# Patient Record
Sex: Female | Born: 1995 | Hispanic: No | Marital: Married | Smoking: Never smoker
Health system: Southern US, Community
[De-identification: ages and names within clinical notes are randomized; demographics above are authoritative.]

## PROBLEM LIST (undated history)

## (undated) DIAGNOSIS — E559 Vitamin D deficiency, unspecified: Secondary | ICD-10-CM

## (undated) DIAGNOSIS — N90810 Female genital mutilation status, unspecified: Secondary | ICD-10-CM

## (undated) DIAGNOSIS — F419 Anxiety disorder, unspecified: Secondary | ICD-10-CM

## (undated) HISTORY — DX: Anxiety disorder, unspecified: F41.9

## (undated) HISTORY — PX: NO PAST SURGERIES: SHX2092

---

## 2015-03-08 ENCOUNTER — Ambulatory Visit (INDEPENDENT_AMBULATORY_CARE_PROVIDER_SITE_OTHER): Payer: Self-pay | Admitting: Internal Medicine

## 2015-03-08 ENCOUNTER — Encounter: Payer: Self-pay | Admitting: Internal Medicine

## 2015-03-08 VITALS — BP 102/66 | HR 68 | Ht 60.0 in | Wt 104.0 lb

## 2015-03-08 DIAGNOSIS — Z3009 Encounter for other general counseling and advice on contraception: Secondary | ICD-10-CM

## 2015-03-08 DIAGNOSIS — K5900 Constipation, unspecified: Secondary | ICD-10-CM

## 2015-03-08 DIAGNOSIS — K219 Gastro-esophageal reflux disease without esophagitis: Secondary | ICD-10-CM

## 2015-03-08 DIAGNOSIS — R42 Dizziness and giddiness: Secondary | ICD-10-CM

## 2015-03-08 MED ORDER — FAMOTIDINE 20 MG PO TABS: ORAL_TABLET | ORAL | Status: DC

## 2015-03-08 MED ORDER — PRENATAL VITAMINS 28-0.8 MG PO TABS: ORAL_TABLET | ORAL | Status: DC

## 2015-03-08 NOTE — Patient Instructions (Addendum)
Please join our walking group! Please volunteer with [employer] 8 glasses of water daily Limit starches and a lot of dairy 5 servings of vegetables daily 2 servings of fruit daily

## 2015-03-08 NOTE — Progress Notes (Signed)
   Subjective:    Patient ID: Samantha Shepherd, female    DOB: 29-Apr-1995, 20 y.o.   MRN: [id number]  HPI  1.  Belching:  Started 1 month ago.  Occurs with eating and sometimes just spontaneously at other times.  Not under a lot of stress.  Does not eat tomatoes and onions.  Does not drink alcohol or smoke, no chewing tobacco.  Previously, used to eat chocolate, but cut back about 1 month ago when started with these symptoms.  Also noted she had gained weight, which caused her to stop eating chocolate.  No NSAIDS.  2-3 cans of Coke or [employer] weekly. Belching usually starts about 5 minutes after a meal.  Sometimes can bring up a bit of food with it.    2.  Dizziness:  Last month.  Noted this beginning of January.  Initially describes spinning, like would fall, then states was like she would pass out.  Associated with nausea.  Sometimes nausea would precede dizziness.  Thought she was pregnant, but turned out she was not--one home pregnancy test was positive, but then multiple others tested negative.  Lasted a couple of weeks and went away.  No sense of palpitations LMP January 20th.  Lasted 6 days, which is fairly normal for her.    3.  Problems conceiving:  See above.  Has been trying to get pregnant for past 1 year.  Pt. Is placing stress on herself with attempts to get pregnant.  Her husband is not.   Menarche at 78 or 36.  Period is fairly regular, though can be late at times.  Often does not have a period during Ramadan when she is fasting.  4.  Constipation:  3 weeks ago.  When passes stool that is hard, can have bleeding on tissue.  Feels she drinks 3 bottle of 20 oz daily  1 cup milk daily.  +starches.  Meds: None  Allergies:  NKDA    Review of Systems     Objective:   Physical Exam Healthy appearing young woman, NAD HEENT:  PERRL, EOMI, TMs pearly gray, throat without injection. Neck:  Supple, no adenopathy, no thyromegaly Chest:  CTA CV:  RRR with normal S1 and S2, No S3, S4 or  murmur.  Radial pulses normal and equal. Abd:  S, NT, No HSM or masses appreciated, +BS throughout. Neuro:  A & O x 3, CN II-XII intact, DTRs 2+/4 throughout, Motor 5/5 throughout, Rapid alternating motions normal, finger-nose-finger normal, No elicited symptoms with Hall-Pike, negative Romber, normal gait.      Assessment & Plan:  1.  GERD:  GERD precautions with avoidance of common food causes.  Start Famotidine 40 mg.  2.  Vertigo:  No findings today.  To keep a journal.  Check CBC, CMP, TSH  3.  Family planning/difficulty attaining pregnancy:  Discussed intercourse every other day in week surrounding 14 days after period.  Husband to avoid wearing underwear to bed--should be loose fitting bottoms.  4.  Constipation:  Check TSH. Discussed high fiber diet and increasing fluids.

## 2015-03-10 LAB — COMPREHENSIVE METABOLIC PANEL
ALT: 33 IU/L — AB (ref 0–32)
AST: 29 IU/L (ref 0–40)
Albumin/Globulin Ratio: 1.6 (ref 1.1–2.5)
Albumin: 4.4 g/dL (ref 3.5–5.5)
Alkaline Phosphatase: 55 IU/L (ref 39–117)
BUN/Creatinine Ratio: 19 (ref 8–20)
BUN: 10 mg/dL (ref 6–20)
CHLORIDE: 99 mmol/L (ref 96–106)
CO2: 23 mmol/L (ref 18–29)
CREATININE: 0.53 mg/dL — AB (ref 0.57–1.00)
Calcium: 9.6 mg/dL (ref 8.7–10.2)
GFR calc non Af Amer: 136 mL/min/{1.73_m2} (ref >59)
GFR, EST AFRICAN AMERICAN: 157 mL/min/{1.73_m2} (ref >59)
GLUCOSE: 85 mg/dL (ref 65–99)
Globulin, Total: 2.7 g/dL (ref 1.5–4.5)
Potassium: 4.5 mmol/L (ref 3.5–5.2)
Sodium: 137 mmol/L (ref 134–144)
TOTAL PROTEIN: 7.1 g/dL (ref 6.0–8.5)

## 2015-03-10 LAB — CBC WITH DIFFERENTIAL/PLATELET
BASOS ABS: 0 10*3/uL (ref 0.0–0.2)
Basos: 0 %
EOS (ABSOLUTE): 0.2 10*3/uL (ref 0.0–0.4)
Eos: 2 %
HEMOGLOBIN: 12.7 g/dL (ref 11.1–15.9)
Hematocrit: 38.2 % (ref 34.0–46.6)
IMMATURE GRANS (ABS): 0 10*3/uL (ref 0.0–0.1)
Immature Granulocytes: 0 %
LYMPHS: 46 %
Lymphocytes Absolute: 3.2 10*3/uL — ABNORMAL HIGH (ref 0.7–3.1)
MCH: 30.5 pg (ref 26.6–33.0)
MCHC: 33.2 g/dL (ref 31.5–35.7)
MCV: 92 fL (ref 79–97)
MONOCYTES: 7 %
Monocytes Absolute: 0.5 10*3/uL (ref 0.1–0.9)
NEUTROS ABS: 3.1 10*3/uL (ref 1.4–7.0)
Neutrophils: 45 %
Platelets: 162 10*3/uL (ref 150–379)
RBC: 4.17 x10E6/uL (ref 3.77–5.28)
RDW: 13.5 % (ref 12.3–15.4)
WBC: 7 10*3/uL (ref 3.4–10.8)

## 2015-03-10 LAB — TSH: TSH: 2.45 u[IU]/mL (ref 0.450–4.500)

## 2015-03-19 ENCOUNTER — Encounter: Payer: Self-pay | Admitting: Internal Medicine

## 2015-04-06 ENCOUNTER — Ambulatory Visit (INDEPENDENT_AMBULATORY_CARE_PROVIDER_SITE_OTHER): Payer: Self-pay | Admitting: Internal Medicine

## 2015-04-06 ENCOUNTER — Encounter: Payer: Self-pay | Admitting: Internal Medicine

## 2015-04-06 VITALS — BP 108/68 | HR 68 | Resp 16 | Ht 60.0 in | Wt 104.5 lb

## 2015-04-06 DIAGNOSIS — K089 Disorder of teeth and supporting structures, unspecified: Secondary | ICD-10-CM

## 2015-04-06 DIAGNOSIS — K5900 Constipation, unspecified: Secondary | ICD-10-CM

## 2015-04-06 DIAGNOSIS — K219 Gastro-esophageal reflux disease without esophagitis: Secondary | ICD-10-CM

## 2015-04-06 DIAGNOSIS — N979 Female infertility, unspecified: Secondary | ICD-10-CM

## 2015-04-06 DIAGNOSIS — L659 Nonscarring hair loss, unspecified: Secondary | ICD-10-CM

## 2015-04-06 NOTE — Progress Notes (Signed)
Subjective:    Patient ID: Samantha Shepherd, female    DOB: February 13, 1995, 20 y.o.   MRN: [id number]  HPI  1.  GERD:  Taking Famotidine 40 mg daily without difficulties.  Now having symptoms once weekly with belching.  No more nausea.  Only has burning into chest if eats something spicy, which she is avoiding.  No blood in stool or melena.  Did have blood with passing a hard stool and straining about 3 weeks ago.  CBC, CMP, TSH all normal with last visit.  2.  Constipation:  TSH normal.  This has resolved with eating more fruit since last visit.  3.  Hair concerns:  Has noted thinning concern since January.  Does not notes lots of hair when combing or on pillow or shower.  She just feels there is less.  Feels her hair thinning is worse since last seen. During exam, patients admits to receding hairline at temples since she was a very young child.  Does pull her hair back rather tightly on a regular basis under her hair covering.  4.  Difficulties with getting pregnant. Menarche was age 39 yo.  Has never had a monthly period for a whole year.  States can be up to 5 times per year where she skips a period or it is 3 weeks late.   Sometimes has more cramping and increased flow when skips a period with the period that follows. Periods generally last 6-7 days, no matter if has skipped or not.   Denies ever having breast tenderness prior to or with periods.   No history of hirsutism. No pelvic pain.   No nipple discharge.   Has never been told her blood glucose is high.   Has never had a pap smear.    5.  Dental concerns:  Would like to be seen by dentist for regular cleaning and check.  No ongoing problems, however.   Current outpatient prescriptions:  .  famotidine (PEPCID) 20 MG tablet, 2 tabs by mouth at bedtime, Disp: 60 tablet, Rfl: 1 .  Prenatal Vit-Fe Fumarate-FA (PRENATAL VITAMINS) 28-0.8 MG TABS, 1 tab by mouth daily in morning as long as no nausea., Disp: 30 tablet, Rfl: 11   Allergies:   NKDA     Review of Systems     Objective:   Physical Exam NAD HEENT: Has significant temporal hairline recession.  Many short hairs populate this area as well as area behind bang line.  Hard to say if these hairs are broken or not.  Her hair is quite long and heavy--very damaged at ends of long hairs.  Hair is heavily oiled, which also makes exam somewhat difficult regarding amount of hair.   No obvious dental cavities, Neck:  Supple, no adenopathy, no thyromegaly Chest:  CTA CV:  RRR without murmur or rub, radial pulses normal and equal. Abd:  S, NT, No HSM or masses, +BS Skin:  No hirsute areas of arms, chest, abdomen, chin.  Pt. Wanted to defer pelvic and pap exam        Assessment & Plan:  1.  GERD:  Much better at this point.  To continue Famotidine for another month and asymptomatic at that point, to use as needed thereafter.  2.  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  With concerns for fertility and perhaps PCOS as well, will start with check of total testosterone. Will gradually order insulin and FSH as well. Encouraged patient to  avoid hairstyle with signficant tension on hair.  3.  Concerns for infertility:  Save for receding hairline, which has been evident since very young, no concerning findings such as hirsutism for hyperandrogenism, however, start with testosterone level.  Will check insulin and FSH next visit due to cost.  Needs pelvic and pap, which have never been performed, but pt. Chose to wait for a specific appt. To do this. [id number]  $88 Insulin 004333  $59.00 Total Testosterone 004226 $128.5  4.  Dental :  [facility] on [employer] given.  Also refer to dental clinic  5.  Constipation;  Resolved with increased fruit.  Discussed increasing vegetable intake as well

## 2015-04-06 NOTE — Patient Instructions (Addendum)
Avoid wearing your hair in styles that put a lot of tension on your hair. [facility] for regular cleaning. Take Famotidine for one more month daily and then go to use only as needed.

## 2015-04-07 LAB — TESTOSTERONE: Testosterone: 23 ng/dL (ref 8–48)

## 2015-05-23 ENCOUNTER — Ambulatory Visit (INDEPENDENT_AMBULATORY_CARE_PROVIDER_SITE_OTHER): Payer: Self-pay | Admitting: Internal Medicine

## 2015-05-23 ENCOUNTER — Encounter: Payer: Self-pay | Admitting: Internal Medicine

## 2015-05-23 VITALS — BP 98/60 | HR 80 | Resp 20 | Ht 60.0 in | Wt 103.5 lb

## 2015-05-23 DIAGNOSIS — L80 Vitiligo: Secondary | ICD-10-CM | POA: Insufficient documentation

## 2015-05-23 NOTE — Patient Instructions (Signed)
Call if you decide you are ready to try the pap and pelvic exam with some medication to calm your anxiety

## 2015-05-23 NOTE — Progress Notes (Signed)
Subjective:    Patient ID: Samantha Shepherd, female    DOB: November 14, 1995, 20 y.o.   MRN: [id number]  HPI   Here for CPE with pap  Health Maintenance:   1.  Last pap:  Never.    2.  SBE:  Never.  Has never had a mammogram for any reason.  3.  Immunizations:  Has not had tetanus since age 13, 10 years ago.  Did not get influenza vaccine.   Immunizations through [employer] up in [place]  4.  Cholesterol:  Has never been checked.   Nonfasting.   Current outpatient prescriptions:  .  famotidine (PEPCID) 20 MG tablet, 2 tabs by mouth at bedtime, Disp: 60 tablet, Rfl: 1 .  Prenatal Vit-Fe Fumarate-FA (PRENATAL VITAMINS) 28-0.8 MG TABS, 1 tab by mouth daily in morning as long as no nausea., Disp: 30 tablet, Rfl: 11   No Known Allergies   History reviewed. No pertinent past medical history.   History reviewed. No pertinent past surgical history.   Family History  Problem Relation Age of Onset  . Epilepsy Sister   . Asthma Sister          Review of Systems  Constitutional: Negative for fever, appetite change and fatigue.  HENT: Negative for dental problem.        Dentist appt. May 8th with [facility]  Eyes:       Vision stable with current lens Rx.   Goes somewhere in [place].  Respiratory:       Rarely when lies down, needs to sigh breaths and then gone in seconds.  Cardiovascular: Negative for chest pain, palpitations and leg swelling.  Gastrointestinal: Negative for nausea, vomiting, abdominal pain, diarrhea, constipation and blood in stool.  Endocrine: Negative for cold intolerance, heat intolerance, polydipsia and polyuria.  Genitourinary: Positive for menstrual problem. Negative for difficulty urinating.       Missed or delayed periods at times.  Missed in April.  Has occurred.  Started after married.  Does not feel stressed.  Musculoskeletal: Negative for arthralgias and gait problem.  Skin:       Developing brown dots on skin--points to tiny moles  on arms and abdomen  Allergic/Immunologic: Negative for environmental allergies.  Neurological: Negative for weakness and numbness.  Psychiatric/Behavioral: Negative for sleep disturbance and dysphoric mood. The patient is not nervous/anxious.        Objective:   Physical Exam  Constitutional: She is oriented to person, place, and time. She appears well-developed and well-nourished.  HENT:  Head: Normocephalic and atraumatic.  Right Ear: Hearing, tympanic membrane, external ear and ear canal normal.  Left Ear: Hearing, tympanic membrane, external ear and ear canal normal.  Nose: Nose normal.  Mouth/Throat: Uvula is midline, oropharynx is clear and moist and mucous membranes are normal. Normal dentition.  Eyes: Conjunctivae and EOM are normal. Pupils are equal, round, and reactive to light.  Discs sharp bilaterally  Neck: Normal range of motion. Neck supple. No thyroid mass and no thyromegaly present.  Cardiovascular: Normal rate, regular rhythm, S1 normal, S2 normal and normal pulses.  Exam reveals no S3, no S4 and no friction rub.   No murmur heard. Pulmonary/Chest: Effort normal and breath sounds normal. Right breast exhibits no inverted nipple, no mass, no nipple discharge, no skin change and no tenderness. Left breast exhibits no inverted nipple, no mass, no nipple discharge, no skin change and no tenderness.  Abdominal: Soft. Bowel sounds are normal. She exhibits no mass. There  is no hepatosplenomegaly. There is no tenderness.  Genitourinary:  External Genitalia appears normal.  Do not see definitive changes of female circumcision. Patient unable to relax for exam.  Unable to insert speculum more than 1 cm.  Pt. Becomes anxious and states it is painful, asking to stop with exam.  Musculoskeletal: Normal range of motion.  Lymphadenopathy:       Head (right side): No submental and no submandibular adenopathy present.       Head (left side): No submental and no submandibular adenopathy  present.    She has no cervical adenopathy.    She has no axillary adenopathy.       Right: No inguinal adenopathy present.       Left: No inguinal adenopathy present.  Neurological: She is alert and oriented to person, place, and time. She has normal strength and normal reflexes. No cranial nerve deficit or sensory deficit. Coordination and gait normal.  Skin: Skin is warm and dry.  Scattered areas of hypopigmentation along entire pretibial area, fairly well demarcated, as well as right inner thigh.  Psychiatric: Her speech is normal and behavior is normal. Judgment and thought content normal. Her mood appears anxious. Cognition and memory are normal.         Assessment & Plan:  1.  CPE without pap and pelvic today as patient could not relax for the exam.  Unable to obtain pap or perform bimanual exam To contemplate coming in and taking a dose of Ativan for the exam.   She is to call if she would like to proceed.   Will only need pap and bimanual exam then. To return fasting for FLP To bring in her immunization record from [place].  Likely due for Td, but need to know if already with Tdap. Recommend monthly SBE Recommend yearly influenza

## 2015-07-27 ENCOUNTER — Other Ambulatory Visit: Payer: Self-pay | Admitting: Internal Medicine

## 2015-07-27 DIAGNOSIS — Z23 Encounter for immunization: Secondary | ICD-10-CM

## 2015-07-27 NOTE — Progress Notes (Signed)
Needs meningococcal 4 valent vaccine for [facility].  Will not be able to get visa without this being administered this week.  [facility] but would not go through as a med.   Will write out on Rx

## 2015-08-01 ENCOUNTER — Other Ambulatory Visit: Payer: Self-pay

## 2016-11-14 ENCOUNTER — Ambulatory Visit: Payer: Self-pay | Admitting: Obstetrics & Gynecology

## 2017-01-30 ENCOUNTER — Ambulatory Visit (INDEPENDENT_AMBULATORY_CARE_PROVIDER_SITE_OTHER): Payer: Self-pay | Admitting: Obstetrics & Gynecology

## 2017-01-30 ENCOUNTER — Encounter: Payer: Self-pay | Admitting: Obstetrics & Gynecology

## 2017-01-30 VITALS — BP 120/82 | Ht 60.0 in | Wt 106.0 lb

## 2017-01-30 DIAGNOSIS — N979 Female infertility, unspecified: Secondary | ICD-10-CM

## 2017-01-30 DIAGNOSIS — Z01419 Encounter for gynecological examination (general) (routine) without abnormal findings: Secondary | ICD-10-CM

## 2017-01-30 DIAGNOSIS — F529 Unspecified sexual dysfunction not due to a substance or known physiological condition: Secondary | ICD-10-CM

## 2017-01-30 NOTE — Progress Notes (Signed)
Samantha Shepherd March 19, 1995 [id number]   History:    22 y.o. G0 Married x 3 yrs.  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  RP:  New patient presenting for annual gyn exam and Primary Infertility x 3 years  HPI:    Past medical history,surgical history, family history and social history were all reviewed and documented in the EPIC chart.  Gynecologic History Patient's last menstrual period was 12/31/2016. Contraception: none Last Pap: Never Last mammogram: N/A  Obstetric History OB History  Gravida Para Term Preterm AB Living  0 0 0 0 0 0  SAB TAB Ectopic Multiple Live Births  0 0 0 0 0         ROS: A ROS was performed and pertinent positives and negatives are included in the history.  GENERAL: No fevers or chills. HEENT: No change in vision, no earache, sore throat or sinus congestion. NECK: No pain or stiffness. CARDIOVASCULAR: No chest pain or pressure. No palpitations. PULMONARY: No shortness of breath, cough or wheeze. GASTROINTESTINAL: No abdominal pain, nausea, vomiting or diarrhea, melena or bright red blood per rectum. GENITOURINARY: No urinary frequency, urgency, hesitancy or dysuria. MUSCULOSKELETAL: No joint or muscle pain, no back pain, no recent trauma. DERMATOLOGIC: No rash, no itching, no lesions. ENDOCRINE: No polyuria, polydipsia, no heat or cold intolerance. No recent change in weight. HEMATOLOGICAL: No anemia or easy bruising or bleeding. NEUROLOGIC: No headache, seizures, numbness, tingling or weakness. PSYCHIATRIC: No depression, no loss of interest in normal activity or change in sleep pattern.     Exam:   BP 120/82 (BP Location: Right Arm, Patient Position: Sitting, Cuff Size: Normal)   Ht 5' (1.524 m)   Wt 106 lb (48.1 kg)   LMP 12/31/2016   BMI 20.70 kg/m   Body mass index is 20.7 kg/m.  General appearance : Well developed well nourished female. No acute distress HEENT: Eyes: no retinal hemorrhage or exudates,  Neck supple, trachea midline, no carotid  bruits, no thyroidmegaly Lungs: Clear to auscultation, no rhonchi or wheezes, or rib retractions  Heart: Regular rate and rhythm, no murmurs or gallops Breast:Examined in sitting and supine position were symmetrical in appearance, no palpable masses or tenderness,  no skin retraction, no nipple inversion, no nipple discharge, no skin discoloration, no axillary or supraclavicular lymphadenopathy Abdomen: no palpable masses or tenderness, no rebound or guarding Extremities: no edema or skin discoloration or tenderness  Pelvic: Vulva normal  Bartholin, Urethra, Skene Glands: Within normal limits             Vagina: No gross lesions or discharge  Cervix: No gross lesions or discharge  Uterus  AV, normal size, shape and consistency, non-tender and mobile  Adnexa  Without masses or tenderness  Anus and perineum  normal   Assessment/Plan:  22 y.o. female for annual exam   1. Well female exam with routine gynecological exam Normal gynecologic exam but intact hymen.  Speculum exam and Pap test declined by patient.  Will proceed with speculum exam and Pap test at follow-up.  Breast exam normal.  2. Primary female infertility Attempted conception for 3 years with sexual activity covering the ovulatory period.  But probably no penetration.  Will proceed with infertility workup only if no conception after adequate timed sexual intercourse is achieved for at least 6 months.  3. Sexual function problem Given her gynecologic exam today barely allowing 1 finger at the hymen, which was intact, and the response of the patient, who said that she never  experienced the penis going deep inside at that location i.e. Vagina, high suspicion that penetration never occurred.  Patient also mentions that her husband's penis is not hard most of the time, but she does feel that sperm is present at the vulva after sexual activity.  Sexual education and counseling done with the patient.  Recommended that patient experiments  with masturbation on her own so that she can feel more comfortable communicating with her husband and guiding him with intercourse.  Will follow up with me in about 3 months to reassess.  Will not proceed with infertility workup until sexual intercourse is optimized.  Counseling on above issues more than 50% for 30 minutes.  Genia Del MD, 11:54 AM 01/30/2017

## 2017-01-30 NOTE — Patient Instructions (Signed)
  1. Well female exam with routine gynecological exam Normal gynecologic exam but intact hymen.  Speculum exam and Pap test declined by patient.  Will proceed with speculum exam and Pap test at follow-up.  Breast exam normal.  2. Primary female infertility Attempted conception for 3 years with sexual activity covering the ovulatory period.  But probably no penetration.  Will proceed with infertility workup only if no conception after adequate timed sexual intercourse is achieved for at least 6 months.  3. Sexual function problem Given her gynecologic exam today barely allowing 1 finger at the hymen, which was intact, and the response of the patient, who said that she never experienced the penis going deep inside at that location i.e. Vagina, high suspicion that penetration never occurred.  Patient also mentions that her husband's penis is not hard most of the time, but she does feel that sperm is present at the vulva after sexual activity.  Sexual education and counseling done with the patient.  Recommended that patient experiments with masturbation on her own so that she can feel more comfortable communicating with her husband and guiding him with intercourse.  Will follow up with me in about 3 months to reassess.  Will not proceed with infertility workup until sexual intercourse is optimized.  Samantha Shepherd, it was a pleasure meeting you today!  I will see you back in 3 months.

## 2017-02-08 ENCOUNTER — Telehealth: Payer: Self-pay | Admitting: *Deleted

## 2017-02-08 NOTE — Telephone Encounter (Signed)
Patient would like to know a name of a good prenatal vitamin you would recommend. Please advise

## 2017-02-10 NOTE — Telephone Encounter (Signed)
They are all good.  OTC generic:  Prenatal Vitamin 28-0.8 mg.  If wants a Brand with prescription: Completenate 29-1 mg chew would be a good, more expensive choice.

## 2017-02-11 NOTE — Telephone Encounter (Signed)
Left message on voicemail any OTC generic.

## 2017-05-27 DIAGNOSIS — Z3400 Encounter for supervision of normal first pregnancy, unspecified trimester: Secondary | ICD-10-CM | POA: Insufficient documentation

## 2017-05-28 ENCOUNTER — Other Ambulatory Visit (HOSPITAL_COMMUNITY)
Admission: RE | Admit: 2017-05-28 | Discharge: 2017-05-28 | Disposition: A | Discharge status: Home / Self Care | Payer: Medicaid Other | Source: Ambulatory Visit | Attending: Certified Nurse Midwife | Admitting: Certified Nurse Midwife

## 2017-05-28 ENCOUNTER — Encounter: Payer: Self-pay | Admitting: Certified Nurse Midwife

## 2017-05-28 ENCOUNTER — Ambulatory Visit (INDEPENDENT_AMBULATORY_CARE_PROVIDER_SITE_OTHER): Payer: Medicaid Other | Admitting: Certified Nurse Midwife

## 2017-05-28 VITALS — BP 130/86 | HR 90 | Temp 98.0°F | Ht 61.0 in | Wt 111.8 lb

## 2017-05-28 DIAGNOSIS — Z3401 Encounter for supervision of normal first pregnancy, first trimester: Secondary | ICD-10-CM | POA: Diagnosis not present

## 2017-05-28 DIAGNOSIS — Z3400 Encounter for supervision of normal first pregnancy, unspecified trimester: Secondary | ICD-10-CM | POA: Insufficient documentation

## 2017-05-28 DIAGNOSIS — N90810 Female genital mutilation status, unspecified: Secondary | ICD-10-CM | POA: Insufficient documentation

## 2017-05-28 DIAGNOSIS — O219 Vomiting of pregnancy, unspecified: Secondary | ICD-10-CM

## 2017-05-28 MED ORDER — VITAFOL-NANO 18-0.6-0.4 MG PO TABS: 1.0000 | ORAL_TABLET | Freq: Every day | ORAL | 12 refills | Status: AC

## 2017-05-28 MED ORDER — DOXYLAMINE-PYRIDOXINE 10-10 MG PO TBEC: DELAYED_RELEASE_TABLET | ORAL | 4 refills | Status: AC

## 2017-05-28 NOTE — Progress Notes (Signed)
Subjective:   Samantha Shepherd is a 22 y.o. G1P0000 at [redacted]w[redacted]d by LMP being seen today for her first obstetrical visit.  Her obstetrical history is significant for severe anxity over vaginal exams. Patient does intend to breast feed. Pregnancy history fully reviewed.  Patient reports nausea, no bleeding, no contractions, no cramping, no leaking and vomiting.  HISTORY: OB History  Gravida Para Term Preterm AB Living  1 0 0 0 0 0  SAB TAB Ectopic Multiple Live Births  0 0 0 0 0    # Outcome Date GA Lbr Len/2nd Weight Sex Delivery Anes PTL Lv  1 Current             Last pap smear was done never, 1st pap smear today with small peds speculum, very difficult.   Past Medical History:  Diagnosis Date  . Anxiety    History reviewed. No pertinent surgical history. Family History  Problem Relation Age of Onset  . Epilepsy Sister   . Asthma Sister    Social History   Tobacco Use  . Smoking status: Never Smoker  . Smokeless tobacco: Never Used  Substance Use Topics  . Alcohol use: No    Alcohol/week: 0.0 oz  . Drug use: No   No Known Allergies Current Outpatient Medications on File Prior to Visit  Medication Sig Dispense Refill  . Prenatal Vit-Fe Fumarate-FA (MULTI PRENATAL PO) Take by mouth.     No current facility-administered medications on file prior to visit.     Review of Systems Pertinent items noted in HPI and remainder of comprehensive ROS otherwise negative.  Exam   Vitals:   05/28/17 1329 05/28/17 1331  BP: 130/86   Pulse: 90   Temp: 98 F (36.7 C)   Weight: 111 lb 12.8 oz (50.7 kg)   Height:   (1.549 m)   Fetal Heart Rate (bpm): 177; doppler  Uterus:  Fundal Height: 14 cm  Pelvic Exam: Perineum: no hemorrhoids, normal perineum   Vulva: normal external genitalia, no lesions, female circumcision status   Vagina:  normal mucosa, normal discharge   Cervix: no lesions and normal, pap smear done.    Adnexa: deferred   Bony Pelvis: average  System:  General: well-developed, well-nourished female in no acute distress   Breast:  normal appearance, no masses or tenderness   Skin: normal coloration and turgor, no rashes   Neurologic: oriented, normal, negative, normal mood   Extremities: normal strength, tone, and muscle mass, ROM of all joints is normal   HEENT PERRLA, extraocular movement intact and sclera clear, anicteric   Mouth/Teeth mucous membranes moist, pharynx normal without lesions and dental hygiene good   Neck supple and no masses   Cardiovascular: regular rate and rhythm   Respiratory:  no respiratory distress, normal breath sounds   Abdomen: soft, non-tender; bowel sounds normal; no masses,  no organomegaly    [place] ordered to confirm dating in office, dates c/w LMP.  Singleton fetus.   Assessment:   Pregnancy: G1P0000 Patient Active Problem List   Diagnosis Date Noted  . 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 Supervision of normal first pregnancy, antepartum 05/27/2017  . Primary vitiligo 05/23/2015     Plan:  1. Supervision of normal first pregnancy, antepartum      - Enroll Patient in Babyscripts - Obstetric Panel, Including HIV - Hemoglobinopathy evaluation - Culture, OB Urine - Cervicovaginal ancillary only - Cytology - PAP - Genetic Screening - Inheritest Core(CF97,SMA,FraX) - [place]  MFM OB COMP + 14 WK; Future - Prenatal-Fe Fum-Methf-FA w/o A (VITAFOL-NANO) 18-0.6-0.4 MG TABS; Take 1 tablet by mouth daily.  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 Nausea/vomiting in pregnancy      - Doxylamine-Pyridoxine (DICLEGIS) 10-10 MG TBEC; Take 1 tablet with breakfast and lunch.  Take 2 tablets at bedtime.  Dispense: 100 tablet; Refill: 4   Initial labs drawn. Continue prenatal vitamins. Genetic Screening discussed, NIPS: ordered. Ultrasound discussed; fetal anatomic survey:  ordered. Problem list reviewed and updated. The nature of [facility] with multiple MDs and other Advanced Practice Providers was explained to patient; also emphasized that residents, students are part of our team. Routine obstetric precautions reviewed. Return in about 1 month (around 06/25/2017) for ROB.     Orvilla Cornwall, CNM [facility], [facility]

## 2017-05-29 ENCOUNTER — Other Ambulatory Visit: Payer: Self-pay

## 2017-05-29 ENCOUNTER — Telehealth: Payer: Self-pay

## 2017-05-29 LAB — CERVICOVAGINAL ANCILLARY ONLY
BACTERIAL VAGINITIS: NEGATIVE
CHLAMYDIA, DNA PROBE: NEGATIVE
Candida vaginitis: NEGATIVE
Neisseria Gonorrhea: NEGATIVE
TRICH (WINDOWPATH): NEGATIVE

## 2017-05-29 NOTE — Telephone Encounter (Signed)
Returned call and advised that PNV were already sent to pharmacy and pharmacy stated that they need insurance info. Pt stated that she would call.

## 2017-05-30 LAB — OBSTETRIC PANEL, INCLUDING HIV
Antibody Screen: NEGATIVE
BASOS ABS: 0 10*3/uL (ref 0.0–0.2)
Basos: 0 %
EOS (ABSOLUTE): 0 10*3/uL (ref 0.0–0.4)
EOS: 0 %
HIV Screen 4th Generation wRfx: NONREACTIVE
Hematocrit: 36.1 % (ref 34.0–46.6)
Hemoglobin: 12.5 g/dL (ref 11.1–15.9)
Hepatitis B Surface Ag: NEGATIVE
IMMATURE GRANULOCYTES: 0 %
Immature Grans (Abs): 0 10*3/uL (ref 0.0–0.1)
LYMPHS ABS: 2.5 10*3/uL (ref 0.7–3.1)
Lymphs: 20 %
MCH: 31.3 pg (ref 26.6–33.0)
MCHC: 34.6 g/dL (ref 31.5–35.7)
MCV: 90 fL (ref 79–97)
MONOS ABS: 0.7 10*3/uL (ref 0.1–0.9)
Monocytes: 5 %
NEUTROS PCT: 75 %
Neutrophils Absolute: 9.1 10*3/uL — ABNORMAL HIGH (ref 1.4–7.0)
PLATELETS: 202 10*3/uL (ref 150–379)
RBC: 4 x10E6/uL (ref 3.77–5.28)
RDW: 13.6 % (ref 12.3–15.4)
RH TYPE: POSITIVE
RPR Ser Ql: NONREACTIVE
Rubella Antibodies, IGG: 3.24 index (ref >0.99)
WBC: 12.3 10*3/uL — ABNORMAL HIGH (ref 3.4–10.8)

## 2017-05-30 LAB — HEMOGLOBINOPATHY EVALUATION
HGB A: 98.1 % (ref 96.4–98.8)
HGB C: 0 %
HGB S: 0 %
HGB VARIANT: 0 %
Hemoglobin A2 Quantitation: 1.9 % (ref 1.8–3.2)
Hemoglobin F Quantitation: 0 % (ref 0.0–2.0)

## 2017-05-30 LAB — HEMOGLOBIN A1C
ESTIMATED AVERAGE GLUCOSE: 94 mg/dL
Hgb A1c MFr Bld: 4.9 % (ref 4.8–5.6)

## 2017-05-30 LAB — VITAMIN D 25 HYDROXY (VIT D DEFICIENCY, FRACTURES): Vit D, 25-Hydroxy: 13.2 ng/mL — ABNORMAL LOW (ref 30.0–100.0)

## 2017-05-31 LAB — CULTURE, OB URINE

## 2017-05-31 LAB — CYTOLOGY - PAP
ADEQUACY: ABSENT
DIAGNOSIS: NEGATIVE

## 2017-05-31 LAB — URINE CULTURE, OB REFLEX

## 2017-06-02 ENCOUNTER — Encounter: Payer: Self-pay | Admitting: Family Medicine

## 2017-06-02 DIAGNOSIS — E559 Vitamin D deficiency, unspecified: Secondary | ICD-10-CM | POA: Insufficient documentation

## 2017-06-03 ENCOUNTER — Other Ambulatory Visit: Payer: Self-pay | Admitting: Certified Nurse Midwife

## 2017-06-03 DIAGNOSIS — E559 Vitamin D deficiency, unspecified: Secondary | ICD-10-CM

## 2017-06-03 DIAGNOSIS — Z3400 Encounter for supervision of normal first pregnancy, unspecified trimester: Secondary | ICD-10-CM

## 2017-06-03 MED ORDER — VITAMIN D (ERGOCALCIFEROL) 1.25 MG (50000 UNIT) PO CAPS: 50000.0000 [IU] | ORAL_CAPSULE | ORAL | 2 refills | Status: DC

## 2017-06-04 ENCOUNTER — Other Ambulatory Visit: Payer: Self-pay

## 2017-06-04 DIAGNOSIS — O285 Abnormal chromosomal and genetic finding on antenatal screening of mother: Secondary | ICD-10-CM

## 2017-06-05 LAB — INHERITEST CORE(CF97,SMA,FRAX)

## 2017-06-07 ENCOUNTER — Other Ambulatory Visit: Payer: Self-pay | Admitting: Certified Nurse Midwife

## 2017-06-07 DIAGNOSIS — Z3400 Encounter for supervision of normal first pregnancy, unspecified trimester: Secondary | ICD-10-CM

## 2017-06-12 ENCOUNTER — Other Ambulatory Visit: Payer: Self-pay | Admitting: Certified Nurse Midwife

## 2017-06-12 ENCOUNTER — Other Ambulatory Visit: Payer: Self-pay

## 2017-06-12 DIAGNOSIS — Z368A Encounter for antenatal screening for other genetic defects: Secondary | ICD-10-CM

## 2017-06-12 DIAGNOSIS — E559 Vitamin D deficiency, unspecified: Secondary | ICD-10-CM

## 2017-06-12 DIAGNOSIS — Z3400 Encounter for supervision of normal first pregnancy, unspecified trimester: Secondary | ICD-10-CM

## 2017-06-12 MED ORDER — VITAMIN D (ERGOCALCIFEROL) 1.25 MG (50000 UNIT) PO CAPS: 50000.0000 [IU] | ORAL_CAPSULE | ORAL | 2 refills | Status: AC

## 2017-06-12 NOTE — Progress Notes (Signed)
Rx was sent to wrong pharmacy .  Resent Rx.

## 2017-06-12 NOTE — Progress Notes (Signed)
Patient notified of uninformative DNA pattern on Panorama.

## 2017-06-20 ENCOUNTER — Encounter (HOSPITAL_COMMUNITY): Payer: Self-pay

## 2017-06-24 ENCOUNTER — Ambulatory Visit (INDEPENDENT_AMBULATORY_CARE_PROVIDER_SITE_OTHER): Payer: Medicaid Other | Admitting: Certified Nurse Midwife

## 2017-06-24 ENCOUNTER — Encounter: Payer: Self-pay | Admitting: Certified Nurse Midwife

## 2017-06-24 VITALS — BP 121/78 | HR 91 | Wt 114.6 lb

## 2017-06-24 DIAGNOSIS — O219 Vomiting of pregnancy, unspecified: Secondary | ICD-10-CM | POA: Diagnosis not present

## 2017-06-24 DIAGNOSIS — Z3402 Encounter for supervision of normal first pregnancy, second trimester: Secondary | ICD-10-CM

## 2017-06-24 DIAGNOSIS — E559 Vitamin D deficiency, unspecified: Secondary | ICD-10-CM | POA: Diagnosis not present

## 2017-06-24 DIAGNOSIS — Z3400 Encounter for supervision of normal first pregnancy, unspecified trimester: Secondary | ICD-10-CM

## 2017-06-24 MED ORDER — ONDANSETRON 8 MG PO TBDP: 8.0000 mg | ORAL_TABLET | Freq: Three times a day (TID) | ORAL | 0 refills | Status: AC | PRN

## 2017-06-24 NOTE — Progress Notes (Signed)
   PRENATAL VISIT NOTE  Subjective:  Samantha Shepherd is a 22 y.o. G1P0000 at 1046w4d being seen today for ongoing prenatal care.  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  Patient reports nausea, no bleeding, no contractions, no cramping, no leaking and vomiting.  Contractions: Not present. Vag. Bleeding: None.  Movement: Absent. Denies leaking of fluid.   The following portions of the patient's history were reviewed and updated as appropriate: allergies, current medications, past family history, past medical history, past social history, past surgical history and problem list. Problem list updated.  Objective:   Vitals:   06/24/17 1433  BP: 121/78  Pulse: 91  Weight: 114 lb 9.6 oz (52 kg)    Fetal Status: Fetal Heart Rate (bpm): 165; doppler   Movement: Absent     General:  Alert, oriented and cooperative. Patient is in no acute distress.  Skin: Skin is warm and dry. No rash noted.   Cardiovascular: Normal heart rate noted  Respiratory: Normal respiratory effort, no problems with respiration noted  Abdomen: Soft, gravid, appropriate for gestational age.  Pain/Pressure: Absent     Pelvic: Cervical exam deferred        Extremities: Normal range of motion.  Edema: None  Mental Status: Normal mood and affect. Normal behavior. Normal judgment and thought content.   Assessment and Plan:  Pregnancy: G1P0000 at 5046w4d  1. Supervision of normal first pregnancy, antepartum     Has gained 8 lbs this pregnancy.  Has anatomy US and genetics counseling scheduled for 06/28/17 d/t hx of uninformative DNA pattern on Panorama.  - AFP, Serum, Open Spina Bifida  2. Vitamin D deficiency      Taking weekly vitamin D.   Preterm labor symptoms and general obstetric precautions including but not limited to vaginal  bleeding, contractions, leaking of fluid and fetal movement were reviewed in detail with the patient. Please refer to After Visit Summary for other counseling recommendations.  Return in about 1 month (around 07/22/2017) for ROB.  Future Appointments  Date Time Provider Department Center  06/28/2017  1:45 PM [facility] US 2 [facility]  06/28/2017  2:00 PM [facility] GENETIC COUNSELING RM [facility]    Roe Coombsachelle A Enedina Pair, CNM

## 2017-06-25 ENCOUNTER — Encounter: Payer: Medicaid Other | Admitting: Certified Nurse Midwife

## 2017-06-27 ENCOUNTER — Other Ambulatory Visit: Payer: Self-pay | Admitting: Certified Nurse Midwife

## 2017-06-27 ENCOUNTER — Ambulatory Visit (HOSPITAL_COMMUNITY)
Admission: RE | Admit: 2017-06-27 | Discharge: 2017-06-27 | Disposition: A | Discharge status: Home / Self Care | Payer: Medicaid Other | Source: Ambulatory Visit | Attending: Certified Nurse Midwife | Admitting: Certified Nurse Midwife

## 2017-06-27 ENCOUNTER — Encounter (HOSPITAL_COMMUNITY): Payer: Self-pay

## 2017-06-27 ENCOUNTER — Other Ambulatory Visit (HOSPITAL_COMMUNITY): Payer: Self-pay | Admitting: *Deleted

## 2017-06-27 ENCOUNTER — Encounter: Payer: Self-pay | Admitting: Certified Nurse Midwife

## 2017-06-27 DIAGNOSIS — O99342 Other mental disorders complicating pregnancy, second trimester: Secondary | ICD-10-CM | POA: Diagnosis not present

## 2017-06-27 DIAGNOSIS — Z362 Encounter for other antenatal screening follow-up: Secondary | ICD-10-CM

## 2017-06-27 DIAGNOSIS — Z363 Encounter for antenatal screening for malformations: Secondary | ICD-10-CM | POA: Diagnosis not present

## 2017-06-27 DIAGNOSIS — Z368A Encounter for antenatal screening for other genetic defects: Secondary | ICD-10-CM | POA: Insufficient documentation

## 2017-06-27 DIAGNOSIS — Z3400 Encounter for supervision of normal first pregnancy, unspecified trimester: Secondary | ICD-10-CM

## 2017-06-27 DIAGNOSIS — Z843 Family history of consanguinity: Secondary | ICD-10-CM

## 2017-06-27 DIAGNOSIS — Z3A18 18 weeks gestation of pregnancy: Secondary | ICD-10-CM | POA: Insufficient documentation

## 2017-06-27 DIAGNOSIS — O289 Unspecified abnormal findings on antenatal screening of mother: Secondary | ICD-10-CM | POA: Insufficient documentation

## 2017-06-27 DIAGNOSIS — F419 Anxiety disorder, unspecified: Secondary | ICD-10-CM | POA: Diagnosis not present

## 2017-06-27 DIAGNOSIS — Z315 Encounter for genetic counseling: Secondary | ICD-10-CM | POA: Diagnosis present

## 2017-06-27 DIAGNOSIS — O281 Abnormal biochemical finding on antenatal screening of mother: Secondary | ICD-10-CM | POA: Diagnosis not present

## 2017-06-27 HISTORY — DX: Vitamin D deficiency, unspecified: E55.9

## 2017-06-27 HISTORY — DX: Female genital mutilation status, unspecified: N90.810

## 2017-06-27 LAB — AFP, SERUM, OPEN SPINA BIFIDA
AFP MOM: 1.19
AFP Value: 57.1 ng/mL
Gest. Age on Collection Date: 17.6 weeks
MATERNAL AGE AT EDD: 24.2 a
OSBR Risk 1 IN: 6704
TEST RESULTS AFP: NEGATIVE
Weight: 115 [lb_av]

## 2017-06-27 NOTE — Progress Notes (Signed)
Genetic Counseling  High-Risk Gestation Note  Appointment Date:  06/27/2017 Referred By: Samantha Shepherd Shepherd, CNM Date of Birth:  [birth year] Partner: Samantha Shepherd Shepherd   Pregnancy History: G1P0000 Estimated Date of Delivery: 11/28/17 Estimated Gestational Age: 67w0dAttending: MRenella Cunas MD   Samantha Shepherd Shepherd was seen for genetic counseling because of no results on noninvasive prenatal screening (NIPS)/prenatal cell [facility] due to uninformative DNA pattern. Ms. DHennekeis 22y.o..     In summary:  Discusseduninformative NIPS (Panorama) result  Reviewedcauses including family history of consanguinity, normal population variation, underlying chromosome conditions ? Couple does not have known consanguinity; however each of their parents are consanguineous and both families are from [place] Discussed alternative screening options ? NIPS using counting methodology-patient declined  Discussed diagnostic testing- amniocentesis declined  Ultrasound performed today- see separate report  Discussed general population carrier screening options ? CF, SMA, and hemoglobinopathies-previously performed and screen neg ? Offered expanded carrier screening given possible consanguinity for couple- patient declined  Samantha Shepherd Shepherd had noninvasive prenatal screening (NIPS) through her referring provider's office. Specifically, she had Panorama screening through [facility] result was not provided due to an uninformative DNA pattern. We spent time discussing genes, chromosomes, and examples of chromosome conditions. We reviewed NIPS technology and that the accuracy of this testing relies on the ability to distinguish specific DNA sequence variations between the mother and the fetus (placental cell free DNA). We discussed that in some cases, it is difficult to distinguish a difference between maternal and placental DNA sequences, making it difficult to interpret the results.  This can happen because the mother and the fetus share more DNA sequences in common than expected, possibly due to general population variation or in cases of consanguinity for couples. Uninformative DNA patterns can also be observed when there is an underlying chromosome/genetic condition in either the pregnancy or the woman or due to limitations of the testing algorithm.   The patient reported no known consanguinity for herself and her husband, but she reported that there is a possibility of consanguinity given that their families [place]. The patient reported that her parents are first cousins to each other, and her husband's parents are also cousins (degree of relation not known today by patient). We discussed that this family history would be expected to increase the degree of DNA sequences that are in common that were analyzed on NIPS and would be a top differential for underlying etiology of uninformative DNA pattern. She understands that other explanations for uninformative DNA pattern cannot be ruled out at this time.   She was counseled regarding maternal age and the association with risk for chromosome conditions due to nondisjunction with aging of the ova.   We reviewed chromosomes, nondisjunction, and the associated risk for fetal aneuploidy related to a maternal age of 22y.o..  She was counseled that the risk for aneuploidy decreases as gestational age increases, accounting for those pregnancies which spontaneously abort.  We specifically discussed Down syndrome (trisomy 271, trisomies 116and 147 and sex chromosome aneuploidies (47,XXX and 47,XXY) including the common features and prognoses of each.   We reviewed that Samantha Shepherd Canceluses a sMusician(Public house manager and that other laboratories ([facility]) use a dArtist(counting), where the individual fragments of DNA are analyzed in slightly different ways.  We discussed additional screening for aneuploidy utilizing a different NIPS technology and detailed anatomy ultrasound. We  reviewed the limitations and benefits of each of these options. We also discussed the option of diagnostic testing via amniocentesis. We reviewed the associated risks, benefits andlimitationsof amniocentesis, including the associated risk for complications.  We discussed the possible results that the tests might provide including: positive, negative, unanticipated, and no result.  After careful consideration, Samantha Shepherd Shepherd declined NIPS through a different platform and also declined amniocentesis.    A complete ultrasound was performed today. The ultrasound report will be sent under separate cover. There were no visualized fetal anomalies or markers suggestive of aneuploidy. Diagnostic testing was declined today.  She understands that screening tests cannot rule out all birth defects or genetic syndromes.  Follow-up ultrasound was scheduled in 4 weeks to complete fetal anatomic survey.   Samantha Shepherd Shepherd  was provided with written information regarding cystic fibrosis (CF), spinal muscular atrophy (SMA) and hemoglobinopathies including the carrier frequency, availability of carrier screening and prenatal diagnosis if indicated.  In addition, we discussed that CF and hemoglobinopathies are routinely screened for as part of the [place] newborn screening panel. The patient previously had carrier screening performed for these conditions and Fragile X through her Ob provider, and all were within normal limits.   Both family histories were reviewed and found to be contributory for two neonatal deaths for the patient's husband's siblings. The patient reported that her husband is one of eight, and that two babies died prior to age 30 years old. No birth defects were described, and no underlying cause was known by the patient today. We discussed that without additional information regarding underlying  etiology, recurrence risk for relatives cannot be assessed.   Given the possible consanguinity for the couple, we discussed the additional screening option of expanded pan-ethnic carrier screening. We discussed that children born to a consanguineous couple are at increased risk for genetic conditions.  This increase in risk is related to the possibility of both parents passing on a recessive gene. We explained that every person carries approximately 7-10 non-working genes that when received in a double dose results in a recessive genetic condition.  In general, unrelated couples have a relatively low risk of having a child with a recessive condition because the likelihood of both parents carrying the same non-working recessive gene is very low.  However, when a couple is related, they have inherited some of their genetic information from the same family member, which leads to an increased chance that they may carry the same recessive gene and have a child with a recessive condition.  We discussed that more closely related couples would have a higher chance and more distantly related couples would have a decreasing change of autosomal recessive and multifactorial conditions in offspring.   There are a myriad of genetic disorders that occur more frequently in specific ethnic groups, those which can be traced to particular geographic locations. We discussed that although these genetic disorders are much more prevalent in specific ethnic groups, as families are becoming increasingly multiracial and multicultural, these conditions can occur in anyone from any race or ethnicity. For this reason, we discussed the availability of ethnic specific genetic carrier screening, professional society (ACOG) recommended carrier testing, and pan-ethnic carrier screening.   We reviewed that ACOG currently recommends that all patients be offered carrier screening for cystic fibrosis, spinal muscular atrophy and  hemoglobinopathies. In addition, they were counseled that there are a variety of genetic screening laboratories that have pan-ethnic, or expanded, carrier screening panels, which evaluate carrier status for a  wide range of genetic conditions. Some of these conditions are severe and actionable, but also rare; others occur more commonly, but are less severe. We discussed that testing options range from screening for a single condition to panels of more than 200 autosomal or X-linked genetic conditions. We reviewed that the prevalence of each condition varies (and often varies with ethnicity). Thus the couples' background risk to be a carrier for each of these various conditions would range, and in some cases be very low or unknown. Similarly, the detection rate varies with each condition and also varies in some cases with ethnicity, ranging from greater than 99% (in the case of hemoglobinopathies) to unknown. We reviewed that a negative carrier screen would thus reduce, but not eliminate the chance to be a carrier for these conditions. For some conditions included on specific pan-ethnic carrier screening panels, the pre-test carrier frequency and/or the detection rate is unknown. Thus, for some conditions, the exact reduction of risk with a negative carrier screening result may not be able to be quantified. We reviewed that in the event that one partner is found to be a carrier for one or more conditions, carrier screening would be available to the partner for those conditions. After thoughtful consideration of their options, Samantha Shepherd Shepherd declined expanded pan-ethnic carrier screening. Without further information regarding the provided family history, an accurate genetic risk cannot be calculated. Further genetic counseling is warranted if more information is obtained.  Samantha Shepherd Shepherd denied exposure to environmental toxins or chemical agents. She denied the use of alcohol, tobacco or street drugs. She denied  significant viral illnesses during the course of her pregnancy. Her medical and surgical histories were noncontributory.   I counseled Ms. [place] regarding the above risks and available options.  The approximate face-to-face time with the genetic counselor was 40 minutes.  Chipper Oman, MS,  Certified Genetic Counselor 06/27/2017

## 2017-06-28 ENCOUNTER — Ambulatory Visit (HOSPITAL_COMMUNITY): Payer: Medicaid Other

## 2017-06-28 ENCOUNTER — Other Ambulatory Visit: Payer: Self-pay | Admitting: Certified Nurse Midwife

## 2017-06-28 DIAGNOSIS — Z3400 Encounter for supervision of normal first pregnancy, unspecified trimester: Secondary | ICD-10-CM

## 2017-07-04 ENCOUNTER — Encounter: Payer: Self-pay | Admitting: *Deleted

## 2017-07-19 ENCOUNTER — Encounter (HOSPITAL_COMMUNITY): Payer: Medicaid Other

## 2017-07-19 ENCOUNTER — Other Ambulatory Visit (HOSPITAL_COMMUNITY): Payer: Medicaid Other

## 2017-07-23 ENCOUNTER — Encounter: Payer: Medicaid Other | Admitting: Certified Nurse Midwife

## 2017-07-26 ENCOUNTER — Ambulatory Visit (HOSPITAL_COMMUNITY): Payer: Medicaid Other

## 2018-01-27 ENCOUNTER — Encounter (HOSPITAL_COMMUNITY): Payer: Self-pay | Admitting: Emergency Medicine

## 2018-01-27 ENCOUNTER — Emergency Department (HOSPITAL_COMMUNITY): Payer: Medicaid - Out of State

## 2018-01-27 ENCOUNTER — Emergency Department (HOSPITAL_COMMUNITY)
Admission: EM | Admit: 2018-01-27 | Discharge: 2018-01-27 | Disposition: A | Discharge status: Home / Self Care | Payer: Medicaid - Out of State | Attending: Emergency Medicine | Admitting: Emergency Medicine

## 2018-01-27 DIAGNOSIS — J4521 Mild intermittent asthma with (acute) exacerbation: Secondary | ICD-10-CM | POA: Diagnosis not present

## 2018-01-27 DIAGNOSIS — R062 Wheezing: Secondary | ICD-10-CM | POA: Diagnosis present

## 2018-01-27 MED ORDER — AEROCHAMBER PLUS FLO-VU MEDIUM MISC
1.0000 | Freq: Once | Status: DC
  Filled 2018-01-27: qty 1

## 2018-01-27 MED ORDER — ALBUTEROL SULFATE (2.5 MG/3ML) 0.083% IN NEBU
INHALATION_SOLUTION | RESPIRATORY_TRACT | Status: AC
  Administered 2018-01-27: 5 mg
  Filled 2018-01-27: qty 6

## 2018-01-27 MED ORDER — ALBUTEROL SULFATE HFA 108 (90 BASE) MCG/ACT IN AERS
2.0000 | INHALATION_SPRAY | RESPIRATORY_TRACT | Status: DC | PRN
  Administered 2018-01-27: 2 via RESPIRATORY_TRACT
  Filled 2018-01-27: qty 6.7

## 2018-01-27 MED ORDER — IPRATROPIUM-ALBUTEROL 0.5-2.5 (3) MG/3ML IN SOLN
3.0000 mL | Freq: Once | RESPIRATORY_TRACT | Status: AC
  Administered 2018-01-27: 3 mL via RESPIRATORY_TRACT
  Filled 2018-01-27: qty 3

## 2018-01-27 NOTE — Discharge Instructions (Addendum)
Return here as needed.  Your x-rays did not show any abnormalities at this time.  Follow-up with a primary care doctor.

## 2018-01-27 NOTE — ED Triage Notes (Signed)
Pt arrives by pov with c/o of asthma. Pt began wheezing while out shopping per patient.

## 2018-01-27 NOTE — ED Notes (Signed)
Patient transported to X-ray 

## 2018-01-31 NOTE — ED Provider Notes (Addendum)
[facility] EMERGENCY DEPARTMENT Provider Note   CSN: [id number] Arrival date & time: 01/27/18  1824     History   Chief Complaint Chief Complaint  Patient presents with  . Asthma    HPI Samantha Shepherd is a 23 y.o. female.  HPI Patient presents to the emergency department with an asthma exacerbation that occurred earlier this evening while she was out shopping.  Patient states she had had some cough over the last 24 hours.  The patient states that she did use her inhaler without significant relief of her symptoms.  Patient states that nothing seems make the condition better or worse.  The patient denies chest pain,headache,blurred vision, neck pain, fever,weakness, numbness, dizziness, anorexia, edema, abdominal pain, nausea, vomiting, diarrhea, rash, back pain, dysuria, hematemesis, bloody stool, near syncope, or syncope. Past Medical History:  Diagnosis Date  . Anxiety   . Female circumcision   . Vitamin D deficiency     Patient Active Problem List   Diagnosis Date Noted  . Abnormal antenatal test 06/27/2017  . Family history of consanguinity 06/27/2017  . [redacted] weeks gestation of pregnancy   . Vitamin D deficiency 06/02/2017  . 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 Supervision of normal first pregnancy, antepartum 05/27/2017  . Primary vitiligo 05/23/2015    Past Surgical History:  Procedure Laterality Date  . NO PAST SURGERIES       OB History    Gravida  1   Para  0   Term  0   Preterm  0   AB  0   Living  0     SAB  0   TAB  0   Ectopic  0   Multiple  0   Live Births  0            Home Medications    Prior to Admission medications   Medication Sig Start Date End Date Taking? Authorizing Provider  Doxylamine-Pyridoxine (DICLEGIS) 10-10 MG TBEC Take 1 tablet with breakfast and lunch.  Take 2 tablets at bedtime. Patient not taking: Reported on 06/24/2017 05/28/17   Orvilla Cornwall A, CNM  ondansetron (ZOFRAN ODT) 8 MG  disintegrating tablet Take 1 tablet (8 mg total) by mouth every 8 (eight) hours as needed for nausea or vomiting. Patient not taking: Reported on 06/27/2017 06/24/17   Roe Coombs, CNM  Prenatal Vit-Fe Fumarate-FA (MULTI PRENATAL PO) Take by mouth.    [provider]  Prenatal-Fe Fum-Methf-FA w/o A (VITAFOL-NANO) 18-0.6-0.4 MG TABS Take 1 tablet by mouth daily. Patient not taking: Reported on 06/24/2017 05/28/17   Orvilla Cornwall A, CNM  Vitamin D, Ergocalciferol, (DRISDOL) 50000 units CAPS capsule Take 1 capsule (50,000 Units total) by mouth every 7 (seven) days. 06/12/17   Roe Coombs, CNM    Family History Family History  Problem Relation Age of Onset  . Epilepsy Sister   . Asthma Sister     Social History Social History   Tobacco Use  . Smoking status: Never Smoker  . Smokeless tobacco: Never Used  Substance Use Topics  . Alcohol use: No    Alcohol/week: 0.0 standard drinks  . Drug use: No     Allergies   Patient has no known allergies.   Review of Systems Review of Systems All other systems negative except as documented in the HPI. All pertinent positives and negatives as reviewed in the HPI.  Physical Exam Updated Vital Signs BP 118/84   Pulse 88  Temp 98.2 F (36.8 C) (Oral)   Resp 20   LMP 02/21/2017   SpO2 99%   Breastfeeding Unknown   Physical Exam Vitals signs and nursing note reviewed.  Constitutional:      General: She is not in acute distress.    Appearance: She is well-developed.  HENT:     Head: Normocephalic and atraumatic.  Eyes:     Pupils: Pupils are equal, round, and reactive to light.  Neck:     Musculoskeletal: Normal range of motion and neck supple.  Cardiovascular:     Rate and Rhythm: Normal rate and regular rhythm.     Heart sounds: Normal heart sounds. No murmur. No friction rub. No gallop.   Pulmonary:     Effort: Pulmonary effort is normal. No respiratory distress.     Breath sounds: Normal breath sounds. No  stridor. No wheezing or rhonchi.  Abdominal:     General: Bowel sounds are normal. There is no distension.     Palpations: Abdomen is soft.     Tenderness: There is no abdominal tenderness.  Skin:    General: Skin is warm and dry.     Capillary Refill: Capillary refill takes less than 2 seconds.     Findings: No erythema or rash.  Neurological:     Mental Status: She is alert and oriented to person, place, and time.     Motor: No abnormal muscle tone.     Coordination: Coordination normal.  Psychiatric:        Behavior: Behavior normal.      ED Treatments / Results  Labs (all labs ordered are listed, but only abnormal results are displayed) Labs Reviewed - No data to display  EKG None  Radiology No results found.  Procedures Procedures (including critical care time)  Medications Ordered in ED Medications  albuterol (PROVENTIL) (2.5 MG/3ML) 0.083% nebulizer solution (5 mg  Given 01/27/18 1841)  ipratropium-albuterol (DUONEB) 0.5-2.5 (3) MG/3ML nebulizer solution 3 mL (3 mLs Nebulization Given 01/27/18 2030)     Initial Impression / Assessment and Plan / ED Course  I have reviewed the triage vital signs and the nursing notes.  Pertinent labs & imaging results that were available during my care of the patient were reviewed by me and considered in my medical decision making (see chart for details).     Received nebulized treatment prior to me seeing her and she feels dramatically better.  I did do a chest x-ray due to the fact she has had a recent cough.  Also did give her another neb treatment to help ensure that her airways were completely open.  Patient feels better at this time will be discharged home.  I did advise her to follow-up with her primary doctor.  Final Clinical Impressions(s) / ED Diagnoses   Final diagnoses:  Mild intermittent asthma with exacerbation    ED Discharge Orders    None       Charlestine Night, PA-C 01/31/18 0002    Charlestine Night, PA-C 01/31/18 0002    Gerhard Munch, MD 01/31/18 1918

## 2019-04-23 IMAGING — CR DG CHEST 2V
2 series · 2 of 2 positions shown · non-contrast
Comparison: None.

CLINICAL DATA: Cough and asthma.  Wheezing.

EXAM:
CHEST - 2 VIEW

[chest pa]
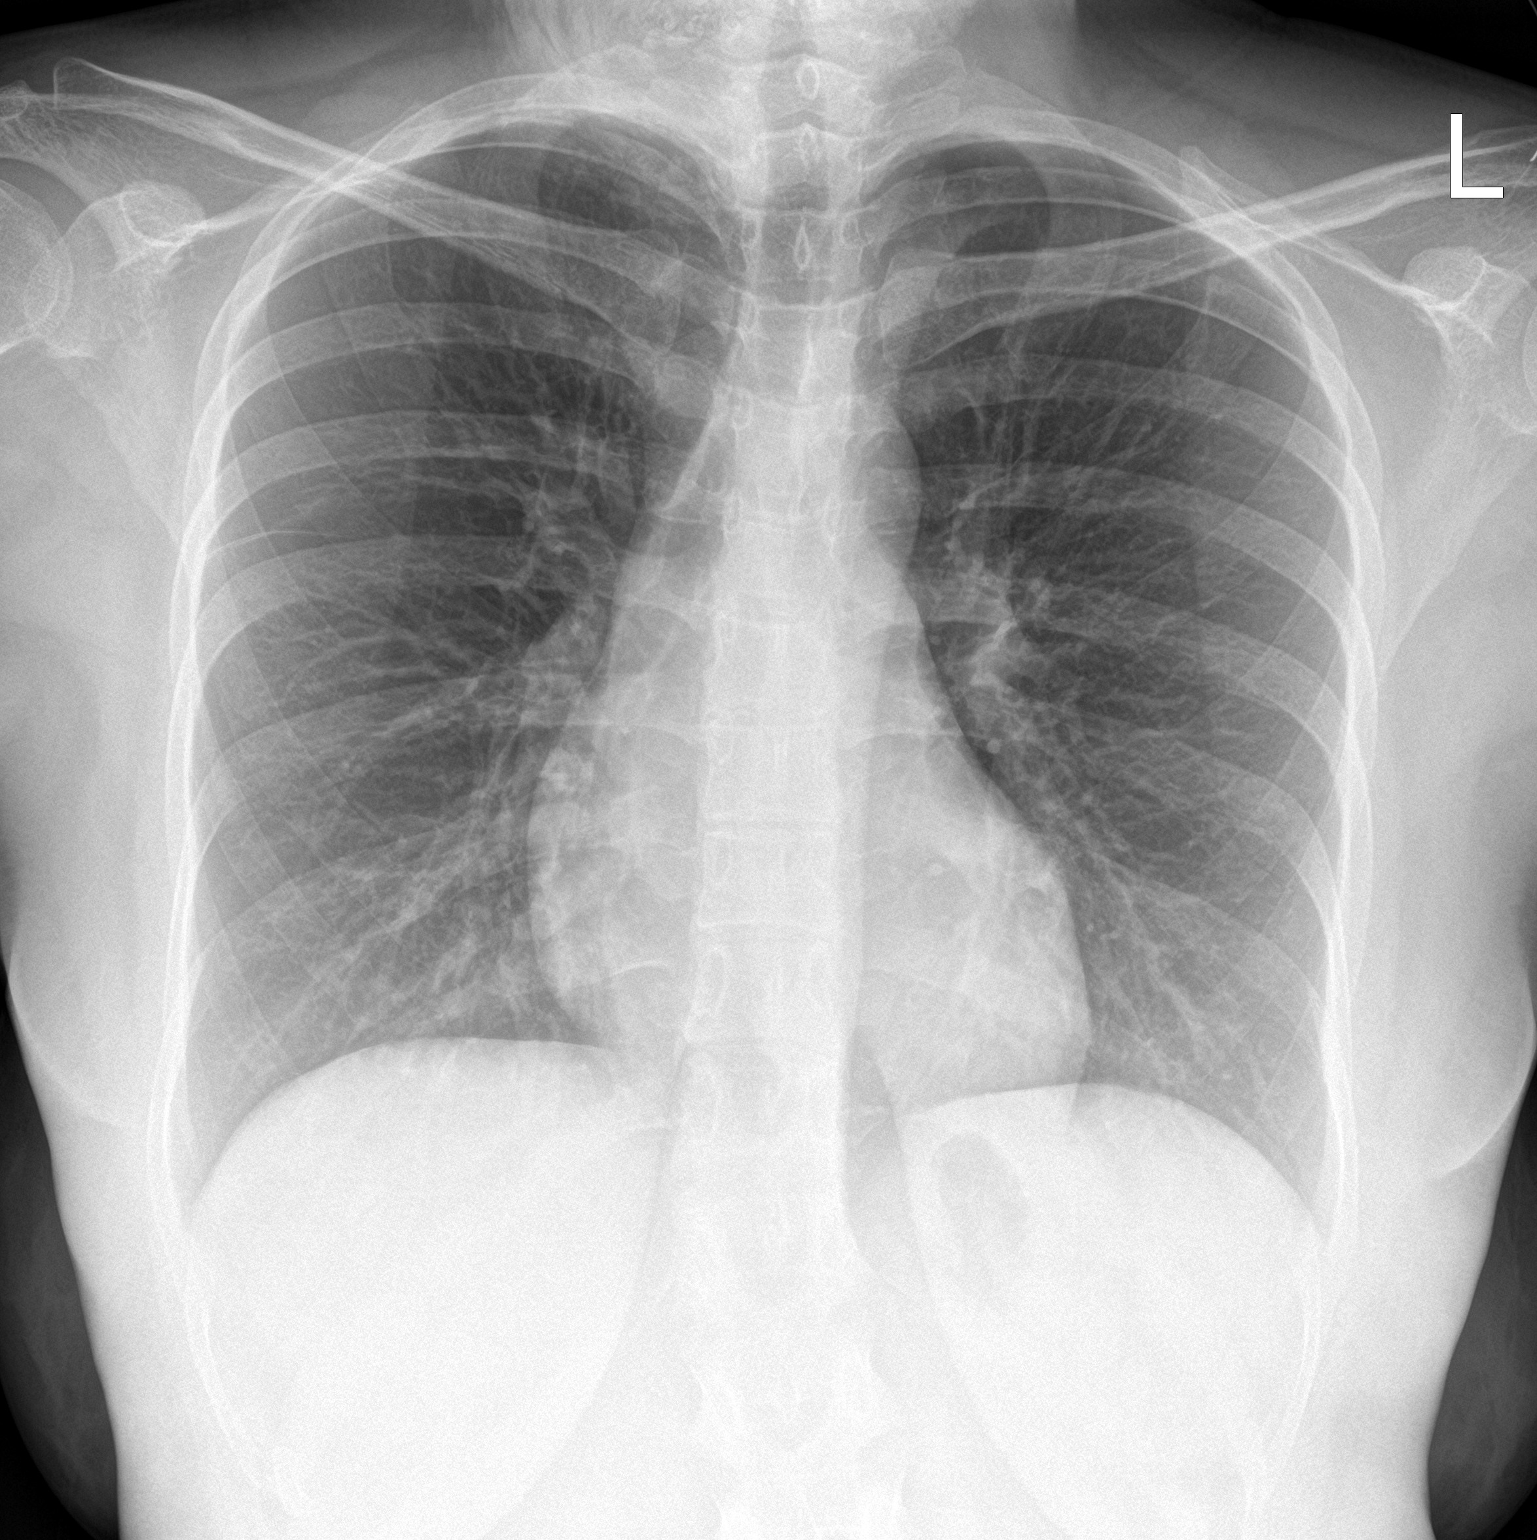

[chest lat]
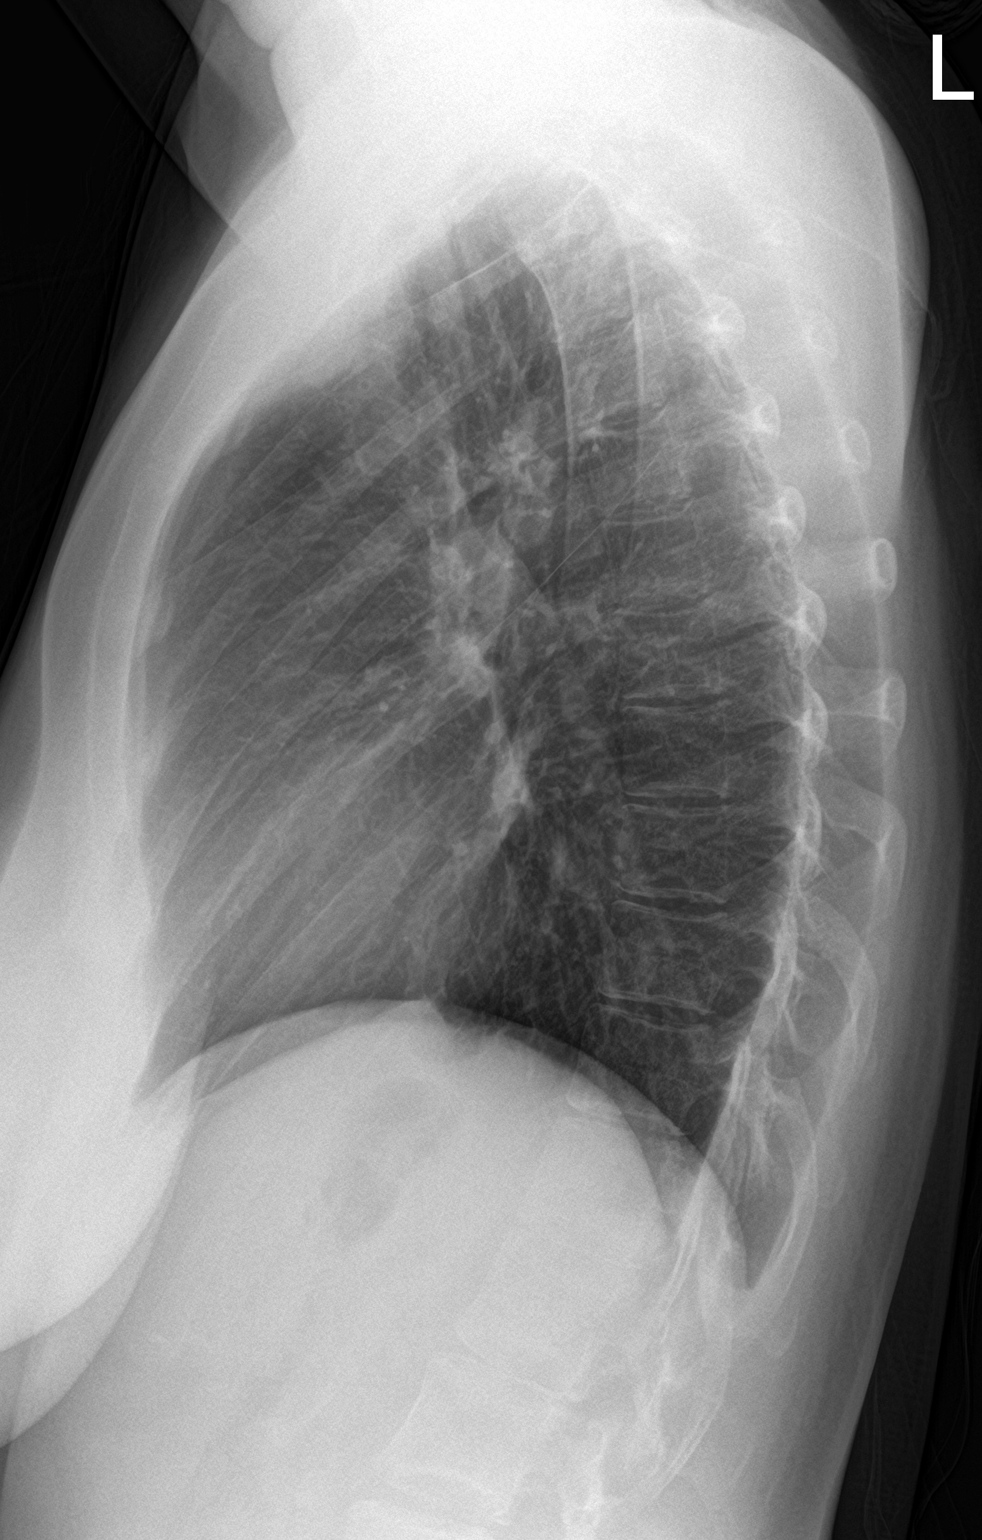

[2 of 2 positions shown; findings below may reference images not displayed]

FINDINGS: Normal cardiac silhouette and mediastinal contours. No focal
parenchymal opacities. There is minimal pleuroparenchymal thickening
about the right major and minor fissures. No focal airspace
opacities. No pleural effusion or pneumothorax. No evidence of
edema. No acute osseous abnormalities.
IMPRESSION: No acute cardiopulmonary disease. Specifically, no evidence of
pneumonia.
